# Patient Record
Sex: Female | Born: 1990 | Race: White | Hispanic: No | Marital: Single | Smoking: Never smoker
Health system: Southern US, Community
[De-identification: ages and names within clinical notes are randomized; demographics above are authoritative.]

---
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
## 2012-05-11 ENCOUNTER — Telehealth: Payer: Self-pay | Admitting: Obstetrics & Gynecology

## 2012-05-11 NOTE — Telephone Encounter (Signed)
LMTCB  aa 

## 2012-05-11 NOTE — Telephone Encounter (Signed)
Spoke with pt who has an appt with SM on Friday, but pt just started new job teaching. Pt has a few questions and was wondering if SM could answer them. Pt is getting married in June and has been getting established with birth control. Was started on lo loestrin, but had a constant cycle on it. Pt saw another doctor who put her on Nuvaring. Pt was told to take it out on the 28th and have a cycle, then insert new one on the 1st. Pt has been doing so and her cycles last about a week, which is normal for her. Pt  wanted to know Dr Rondel Baton opinion of nuvaring. Pt also doesn't want to be on her cycle for her honeymoon and wonders if she can alter the schedule to put a new one in safely to avoid a period in June. Pt understands if Dr Hyacinth Meeker still wants to see her, but wanted to see if her questions could be answered possibly without a visit. Please advise.

## 2012-05-12 NOTE — Telephone Encounter (Signed)
I think Nuva ring is a good BC method.  She can use nuva rings one right after the other if she wants to try and skip a cycle.  There is only 28 days worth of hormone in the Nuva ring so if she waits until the 28th of May and takes out the ring and then puts another one right in, she will probably spot.  She should take the first one out on the 21st and then put a new one in.  That second ring should last her for 28 days.  I don't know her wedding date but that should work as long as the wedding is mid month.  If she doesn't understand, just have her come in.  She still may spot doing this.  It's not uncommon but worth a try.

## 2012-05-12 NOTE — Telephone Encounter (Signed)
Appt. Scheduled for Friday with Dr, Hyacinth Meeker to discuss options.

## 2012-05-13 ENCOUNTER — Encounter: Payer: Self-pay | Admitting: Obstetrics & Gynecology

## 2012-05-14 ENCOUNTER — Ambulatory Visit (INDEPENDENT_AMBULATORY_CARE_PROVIDER_SITE_OTHER): Payer: 59 | Admitting: Obstetrics & Gynecology

## 2012-05-14 ENCOUNTER — Institutional Professional Consult (permissible substitution): Payer: Self-pay | Admitting: Obstetrics & Gynecology

## 2012-05-14 ENCOUNTER — Encounter: Payer: Self-pay | Admitting: Obstetrics & Gynecology

## 2012-05-14 VITALS — BP 102/60 | Ht 66.25 in | Wt 134.0 lb

## 2012-05-14 DIAGNOSIS — Z309 Encounter for contraceptive management, unspecified: Secondary | ICD-10-CM

## 2012-05-14 MED ORDER — ETONOGESTREL-ETHINYL ESTRADIOL 0.12-0.015 MG/24HR VA RING: VAGINAL_RING | VAGINAL | Status: DC

## 2012-05-14 NOTE — Patient Instructions (Signed)
Replace Nuva ring today.  Leave for four weeks.  Take out (May 23) for four days.  Have a cycle and replace a new one.  You should be good for your wedding.

## 2012-05-14 NOTE — Progress Notes (Signed)
22 y.o. Married Caucasian G0P0here for discussion of Nuva ring.  Each month she is putting in the Nuva ring on the first of the month and takes it out on the 28th.  She is interested in a [place] IUD but is getting married in less than two months.  She is afraid to have it placed right now and I agree because she will probably end up having irregular bleeding/spotting/cramping during some of the time leading up to her wedding.  She would like to not be bleeding during her wedding or honeymoon in [place].  reveiwed with pt how to accomplish her desires.  She is going to remove and replace Nuva ring now.  She will leave new one in for four weeks and then remove on 5/23.  She will leave ring out for four days and replace a new one then.  This should work for her to change when he cycle will occur.   She would like to try and not have cycles at all.  Asks about safety of this.  Advised she can just use ring continuously but replace before 28 days since this is the most hormone that is in the ring.  She knows if she has heavy spotting to just removed for four days, cycle, and start again.  She voices clear understanding of above.    O: Healthy female, WD WN Affect: normal orientation X 3    A: Contraception surveillance  P: Directions as per above.  Rx for Nuva ring continuously sent to pharmacy.  Two samples given.  15 minutes spent with patient with >50% of time spent in face to face counseling.

## 2012-06-07 ENCOUNTER — Telehealth: Payer: Self-pay | Admitting: Obstetrics & Gynecology

## 2012-06-07 NOTE — Telephone Encounter (Signed)
Patient called about general question about birth control.

## 2012-06-07 NOTE — Telephone Encounter (Signed)
Patient needs reassurance of menstrual cycle while using Nuvaring and taking out on may 23rd as discussed with your due to her wedding coming up in 3 weeks. Last week begining of week had spotting and became heavy bleeding till sat and Sunday got a little less. Question should she still take Nuvaring out on the 23rd and leave out 2 weeks then put another in ? Please advise. Jamie Love. / was hard to keep up with patient concerning this, Very Anxious about her cycle and wedding day/ honeymoon time frame.

## 2012-06-08 NOTE — Telephone Encounter (Signed)
If you read my office note from last visit it is very clear what I told her.  Take ring out on the 23rd.  Leave out for four days only.  Put new ring in after four days.  Then continue using ring normally.  Use for three weeks, then out for one.  Her bleeding may be irregular this month but that is usually what happens when we try to change the days of her cycle for something like a wedding.  The last ring she should have left in place for four weeks.  Make sure she has enough rings.  She may need a sample.  After the wedding, she is going to try and use them continuously but she should not start that yet.  Please let me know if this still isn't clear.

## 2012-06-08 NOTE — Telephone Encounter (Signed)
Patient has started BTB while on Nuvaring and wants to know if that changes Dr Rondel Baton instructions. Advised BTB very common side effect, esp when adjusting a cycle and should not change the plan.  Leave ring in till 06-11-12, regardless of bleeding, then remove for 4 days and restart. Voiced understanding.

## 2012-12-07 ENCOUNTER — Telehealth: Payer: Self-pay | Admitting: Obstetrics & Gynecology

## 2012-12-07 NOTE — Telephone Encounter (Signed)
Patient 's mom calling re: patient having pelvic pain, bleeding for two weeks. Please call patient at number listed to assess her. Patient's mom thinks the patient may need an ultrasound at her next appointment 01/10/13 for her AEX--FYI.

## 2012-12-07 NOTE — Telephone Encounter (Signed)
Message left to return call to Jamie Love at [phone].    

## 2012-12-07 NOTE — Telephone Encounter (Signed)
Patient returning Tracy's call. °

## 2012-12-09 NOTE — Telephone Encounter (Signed)
Message left to return call to Rajesh Wyss at [phone].    

## 2012-12-09 NOTE — Telephone Encounter (Signed)
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 Pt has been having trouble with her birth control and bleeding. For the past 2 months she has been bleeding 3 out of 4 weeks with cramping and "weird abdominal pain." Pt is in [place], but will be home for Thanksgiving. Scheduled appt for 12-15-12 at 10:30 with SM, who pt prefers to see.

## 2012-12-10 NOTE — Telephone Encounter (Signed)
Patient is returning a call to Tracy. °

## 2012-12-13 NOTE — Telephone Encounter (Signed)
Shouldn't be a problem.  Just take out the nuva ring and leave it out.  Send to [place] for precert tomorrow please.

## 2012-12-13 NOTE — Telephone Encounter (Signed)
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 Pt coming to see you at 10:30 Wednesday. She was just wondering if it would be possible to get the [facility] at her appt. It would need a precert and she would need cytotec beforehand. Thoughts?

## 2012-12-13 NOTE — Telephone Encounter (Signed)
Spoke with pt who is still having bleeding off and on since 11-24-12. Pt is spotting some days. Pt reports some cramping that seems to happen mostly on weekends. 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 Pt on Nuvaring and thinks it just isn't the right [facility] for her. She had prolonged bleeding in October as well, bleeding 2.5 to 3 weeks then also. She has been taking her Nuvaring out on the 23rd of each month and putting a new one back in the same day, which is what she was told to do. Pt still interested in [place] and wonders if we would have enough time to plan an insertion between now and her appt on Wednesday. Advised I would route to SM and look into precerting.

## 2012-12-13 NOTE — Telephone Encounter (Signed)
See about 4pm tomorrow.

## 2012-12-13 NOTE — Telephone Encounter (Signed)
LMTCB for update on how she is doing and if she is keeping appt on 11-26.

## 2012-12-14 ENCOUNTER — Other Ambulatory Visit: Payer: Self-pay | Admitting: Orthopedic Surgery

## 2012-12-14 MED ORDER — MISOPROSTOL 200 MCG PO TABS: ORAL_TABLET | ORAL | Status: DC

## 2012-12-14 NOTE — Telephone Encounter (Signed)
RX done to pharmacy.  Make sure takes 800mg  Motrin about 1 hour before appt.  Encounter closed.

## 2012-12-14 NOTE — Telephone Encounter (Signed)
Spoke with pt who requests [facility] in [place]. Pt will be coming home tonight and her mom will pick up med for her. Advised Shanda Bumps will be calling back with OOP cost. 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

## 2012-12-14 NOTE — Telephone Encounter (Signed)
Reached pt's VM confirming name and LM to take out Nuvaring and leave it out. Use BUM. Advised that SM is agreeable for Skyla at her appt. Pt to call back with pharmacy info for [place] to send cytotec Rx.

## 2012-12-14 NOTE — Telephone Encounter (Signed)
Dr. Hyacinth Meeker,  Do you want cytotec 200 mg PV tonight and in the morning for her?

## 2012-12-15 ENCOUNTER — Other Ambulatory Visit: Payer: Self-pay | Admitting: *Deleted

## 2012-12-15 ENCOUNTER — Encounter: Payer: Self-pay | Admitting: Obstetrics & Gynecology

## 2012-12-15 ENCOUNTER — Other Ambulatory Visit: Payer: Self-pay

## 2012-12-15 ENCOUNTER — Ambulatory Visit
Admission: RE | Admit: 2012-12-15 | Discharge: 2012-12-15 | Disposition: A | Discharge status: Home / Self Care | Payer: 59 | Source: Ambulatory Visit | Attending: Obstetrics & Gynecology | Admitting: Obstetrics & Gynecology

## 2012-12-15 ENCOUNTER — Ambulatory Visit (INDEPENDENT_AMBULATORY_CARE_PROVIDER_SITE_OTHER): Payer: 59 | Admitting: Obstetrics & Gynecology

## 2012-12-15 ENCOUNTER — Other Ambulatory Visit: Payer: Self-pay | Admitting: Obstetrics & Gynecology

## 2012-12-15 VITALS — BP 116/76 | HR 72 | Resp 14 | Ht 66.25 in | Wt 135.0 lb

## 2012-12-15 DIAGNOSIS — N9489 Other specified conditions associated with female genital organs and menstrual cycle: Secondary | ICD-10-CM

## 2012-12-15 DIAGNOSIS — R5381 Other malaise: Secondary | ICD-10-CM

## 2012-12-15 DIAGNOSIS — N949 Unspecified condition associated with female genital organs and menstrual cycle: Secondary | ICD-10-CM

## 2012-12-15 DIAGNOSIS — N938 Other specified abnormal uterine and vaginal bleeding: Secondary | ICD-10-CM

## 2012-12-15 DIAGNOSIS — Z30430 Encounter for insertion of intrauterine contraceptive device: Secondary | ICD-10-CM

## 2012-12-15 LAB — CBC
HCT: 36.2 % (ref 36.0–46.0)
MCH: 29.2 pg (ref 26.0–34.0)
MCHC: 35.4 g/dL (ref 30.0–36.0)
MCV: 82.5 fL (ref 78.0–100.0)
Platelets: 219 10*3/uL (ref 150–400)
RBC: 4.39 MIL/uL (ref 3.87–5.11)
RDW: 13.2 % (ref 11.5–15.5)
WBC: 5.8 10*3/uL (ref 4.0–10.5)

## 2012-12-15 MED ORDER — AZITHROMYCIN 250 MG PO TABS: ORAL_TABLET | ORAL | Status: DC

## 2012-12-15 NOTE — Patient Instructions (Signed)
IUD PLACEMENT POST PROCEDURE INSTRUCTIONS  1. You may take Ibuprofen, Aleve or Tylenol for pain as needed.      Cramping should resolve within 24 hours.  2. You may have a small amount of spotting. You should wear a mini pad for the next few days  3. You may have intercourse after 24 hours. If you are using this for birth control, it is effective immediately.  4. You need to call if you have any pelvic pain, fever, heavy bleeding or foul smelling vaginal discharge. Irregular bleeding is common the first several months after having an IUD placed. You do not need to for this reason unless you are                     concerned.   5. Shower or bathe as normal  6. You should have a follow-up appointment in 4-8 weeks for a re-check to make sure you are not having any problems.  

## 2012-12-15 NOTE — Progress Notes (Signed)
Subjective:     Patient ID: Jamie Love, female   DOB: [birth year], 22 y.o.   MRN: [id number]  HPI 22 yo g0 here for [facility] IUD placement.  Before that, she wants to tell me about some symptoms that have been going on.  She did not discuss this with traige nurse when called for IUD placement.  Has been using Nuva ring with no regularity in cycles.  Has been on loloestrin as well as Loestrin with same issues.  Has been bleeding for a month now.  Took out [employer] on Sunday and flow has been heavier since.    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  Has a sinus issue now.  Wants recommendations.  Feeling fatigued.  Wonders if related to bleeding.    Also, having some GI issues and pelvic pain.  Can't always tell which is which.  Sometimes feels like she is cramping, sometimes like the pain is higher and is "a stomach ache".  Often feels relief from this if she has a bowel movement.  No constipation.  No diarrhea.  Having more stress with work.  Doesn't love classroom or teaching.  Calls her mom almost every morning with complaints of work.  A little tearful today.  Review of Systems  All other systems reviewed and are negative.       Objective:   Physical Exam  Constitutional: She appears well-developed and well-nourished.  HENT:  Head: Normocephalic and atraumatic.  Neck: Normal range of motion. Neck supple. No thyromegaly present.  Cardiovascular: Normal rate and regular rhythm.   Pulmonary/Chest: Effort normal and breath sounds normal.  Abdominal: Soft. Bowel sounds are normal.  Genitourinary: Vagina normal and uterus normal.  Musculoskeletal: Normal range of motion.  Lymphadenopathy:    She has no cervical adenopathy.  Skin: Skin is warm and dry.  Psychiatric: She has a normal mood and affect.   Decided to proceed with IUD placement.  After patient read information booklet and all questions were answered, informed consent was obtained.    Procedure:  Speculum inserted into  vagina. Cervix visualized and cleansed with betadine solution X 3. Paracervical block was placed.  1% Lidocaine was used.  10cc total.  Tenaculum placed on cervix at 12 o'clock position.  Uterus sounded to 8 centimeters.  Decided at this time to place Mirena instead of Skyla IUD.   IUD removed from sterile packet and under sterile conditions inserted to fundus of uterus.  Introducer removed without difficulty.  IUD string trimmed to 3 centimeters.  Remainder string given to patient to feel for identification.  Tenaculum removed.  minimal bleeding noted.  Speculum removed.  Uterus palpated normal.  Patient tolerated procedure well.  IUD Lot #:[id number].  Exp: 8/16.  Package information attached to consent and scanned into EPIC.    Assessment:     IUD placement today DUB, pelvic pain Possible IBS     Plan:     P: Return to office 4 weeks for recheck Pt knows IUD needs to be replaced approximately 5 years.  Post procedure instructions provided. PUS today. TSH and CBC today Z pak to pharmacy.

## 2012-12-16 LAB — TSH: TSH: 0.915 u[IU]/mL (ref 0.350–4.500)

## 2012-12-20 ENCOUNTER — Encounter: Payer: Self-pay | Admitting: Obstetrics & Gynecology

## 2012-12-20 ENCOUNTER — Telehealth: Payer: Self-pay

## 2012-12-20 NOTE — Telephone Encounter (Signed)
Patient is calling Kelly back. °

## 2012-12-20 NOTE — Telephone Encounter (Signed)
LMTCB to check on patient.

## 2012-12-21 NOTE — Telephone Encounter (Signed)
See result note for lab work.

## 2013-01-10 ENCOUNTER — Ambulatory Visit (INDEPENDENT_AMBULATORY_CARE_PROVIDER_SITE_OTHER): Payer: 59 | Admitting: Obstetrics & Gynecology

## 2013-01-10 ENCOUNTER — Encounter: Payer: Self-pay | Admitting: Obstetrics & Gynecology

## 2013-01-10 VITALS — BP 124/62 | HR 58 | Resp 16 | Ht 66.25 in | Wt 137.4 lb

## 2013-01-10 DIAGNOSIS — Z01419 Encounter for gynecological examination (general) (routine) without abnormal findings: Secondary | ICD-10-CM

## 2013-01-10 NOTE — Progress Notes (Signed)
22 y.o. G0P0 Married CaucasianF here for annual exam.  Had IUD placed about a month ago.  No spotting.  Cramping is gone.  Hasn't had a "real" cycle yet.  Not feeling hormonal.  Out of school for break and she can tell that this has helped as well.    Patient's last menstrual period was 12/20/2012.          Sexually active: yes  The current method of family planning is IUD.    Exercising: yes  cardio and weights Smoker:  no  Health Maintenance: Pap:  10/30/11 WNL History of abnormal Pap:  no MMG:  none Colonoscopy:  none BMD:   none  TDaP:  ? 2008 Screening Labs: none today, Hb today: none today, Urine today: not done   reports that she has never smoked. She has never used smokeless tobacco. She reports that she does not drink alcohol or use illicit drugs.  History reviewed. No pertinent past medical history.  Past Surgical History  Procedure Laterality Date  . Fracture surgery  5/08    tibia/fibula compound fracture (3 surgeries)    Current Outpatient Prescriptions  Medication Sig Dispense Refill  . levonorgestrel (MIRENA) 20 MCG/24HR IUD 1 each by Intrauterine route once.       No current facility-administered medications for this visit.    Family History  Problem Relation Age of Onset  . Breast cancer Maternal Grandmother 37  . Bladder Cancer Other     MGGF    ROS:  Pertinent items are noted in HPI.  Otherwise, a comprehensive ROS was negative.  Exam:   BP 124/62  Pulse 58  Resp 16  Ht 5' 6.25" (1.683 m)  Wt 137 lb 6.4 oz (62.324 kg)  BMI 22.00 kg/m2  LMP 12/20/2012  Weight change: +7lbs   Height: 5' 6.25" (168.3 cm)  Ht Readings from Last 3 Encounters:  01/10/13 5' 6.25" (1.683 m)  12/15/12 5' 6.25" (1.683 m)  05/14/12 5' 6.25" (1.683 m)    General appearance: alert, cooperative and appears stated age Head: Normocephalic, without obvious abnormality, atraumatic Neck: no adenopathy, supple, symmetrical, trachea midline and thyroid normal to inspection and  palpation Lungs: clear to auscultation bilaterally Breasts: normal appearance, no masses or tenderness Heart: regular rate and rhythm Abdomen: soft, non-tender; bowel sounds normal; no masses,  no organomegaly Extremities: extremities normal, atraumatic, no cyanosis or edema Skin: Skin color, texture, turgor normal. No rashes or lesions Lymph nodes: Cervical, supraclavicular, and axillary nodes normal. No abnormal inguinal nodes palpated Neurologic: Grossly normal   Pelvic: External genitalia:  no lesions              Urethra:  normal appearing urethra with no masses, tenderness or lesions              Bartholins and Skenes: normal                 Vagina: normal appearing vagina with normal color and discharge, no lesions              Cervix: no lesions, IUD string seen              Pap taken: no Bimanual Exam:  Uterus:  normal size, contour, position, consistency, mobility, non-tender              Adnexa: normal adnexa and no mass, fullness, tenderness               Rectovaginal: Confirms  Anus:  normal sphincter tone, no lesions  A:  Well Woman with normal exam S/P IUD placement, Mirena placed 12/15/12  P:   Mammogram starting age 77 pap smear 2013, neg.  No Pap today. return annually or prn  An After Visit Summary was printed and given to the patient.

## 2013-01-10 NOTE — Patient Instructions (Signed)

## 2013-04-19 ENCOUNTER — Telehealth: Payer: Self-pay | Admitting: Obstetrics & Gynecology

## 2013-04-19 NOTE — Telephone Encounter (Signed)
Patient wants to know if dr Hyacinth Meekermiller would call in some more zpac. She thinks she has had a sinus infection for about a month now and said dr Hyacinth Meekermiller has just called them in before for her. Unable to even come in for appt if we wanted her too. But advised that she may have to go to a urgent care since we don't do primary care. She lives in [place].

## 2013-04-19 NOTE — Telephone Encounter (Signed)
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  Reports sinus congestion and cold symptoms for 5 weeks and is going out of town on spring break and was hoping Dr Hyacinth MeekerMiller would call in z-pack.  Advised that it is our protocol that patients need to be seen and evaluated before treatment and although we have made exceptions in past, this is not our policy.  She really needs to be seen and possibly need additional treament, recommend urgent care for evaluation. Patient agreeable.  Routing to provider for final review. Patient agreeable to disposition. Will close encounter

## 2013-11-04 ENCOUNTER — Other Ambulatory Visit: Payer: Self-pay

## 2014-12-10 IMAGING — US US PELVIS COMPLETE
1 series · 14 of 25 positions shown · non-contrast
Comparison: CT abdomen pelvis 11/06/2004.

CLINICAL DATA: Dysfunctional uterine bleeding, pelvic pain.

EXAM:
TRANSABDOMINAL AND TRANSVAGINAL ULTRASOUND OF PELVIS
TECHNIQUE: Both transabdominal and transvaginal ultrasound examinations of the
pelvis were performed. Transabdominal technique was performed for
global imaging of the pelvis including uterus, ovaries, adnexal
regions, and pelvic cul-de-sac. It was necessary to proceed with
endovaginal exam following the transabdominal exam to visualize the
endometrium and ovaries.

[Series 1: us pelvis complete · 0.32mm/px · 14 of 53 slices shown]
[im 1/53]
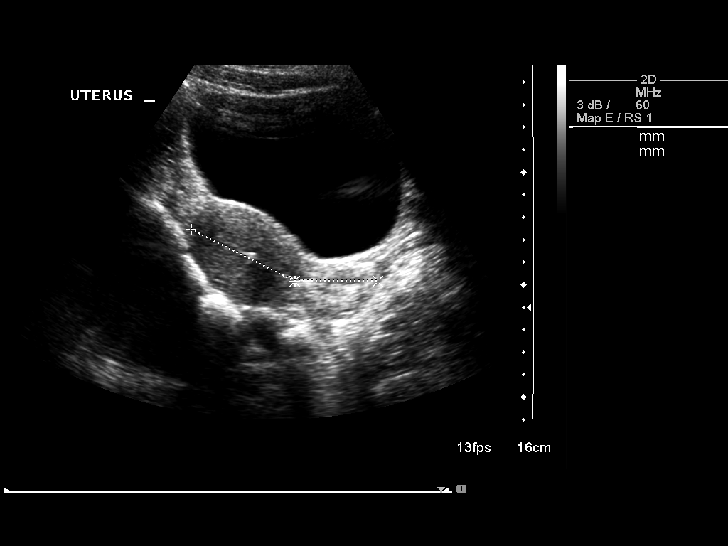
[im 5/53]
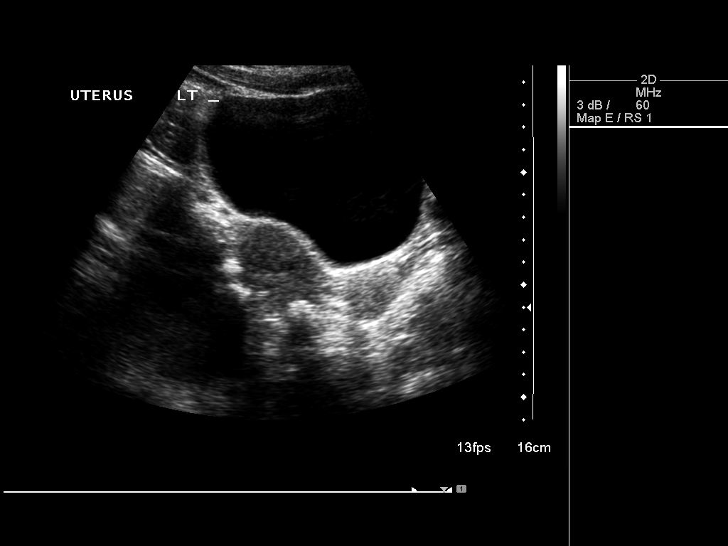
[im 9/53]
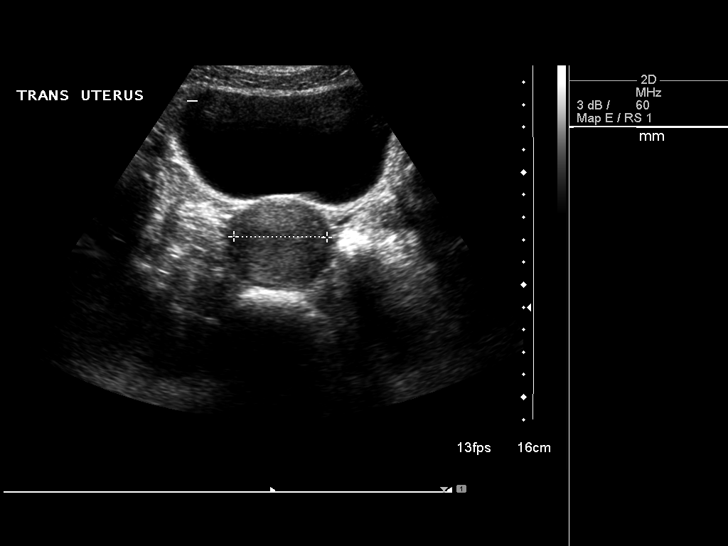
[im 14/53]
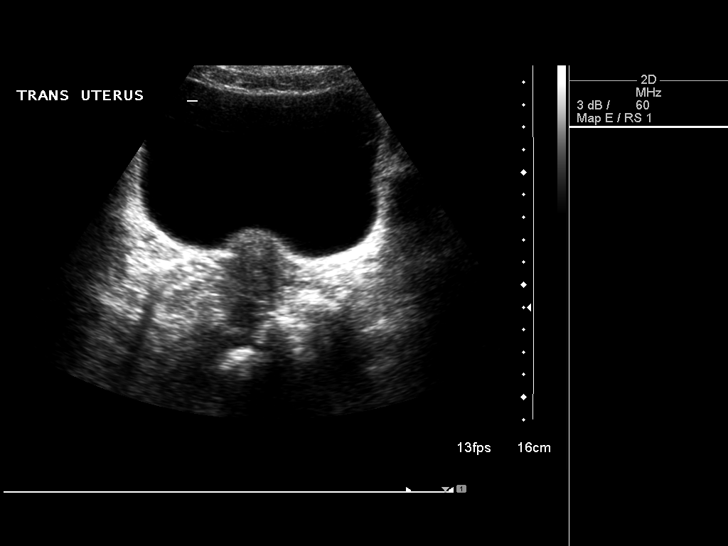
[im 18/53]
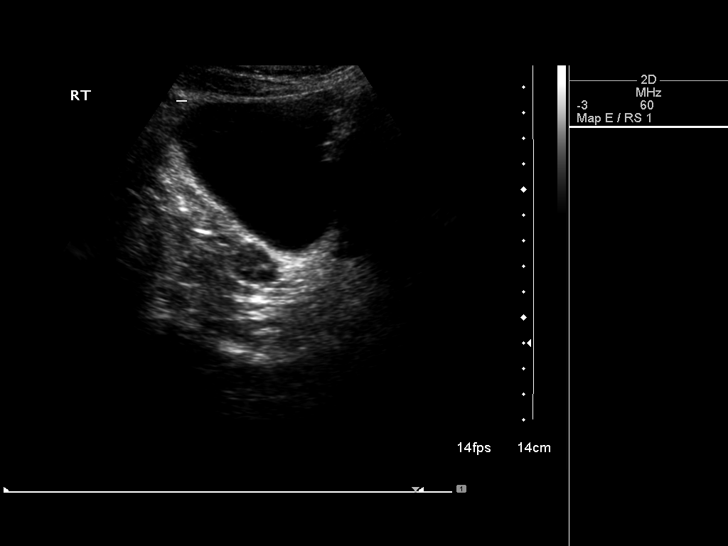
[im 20/53]
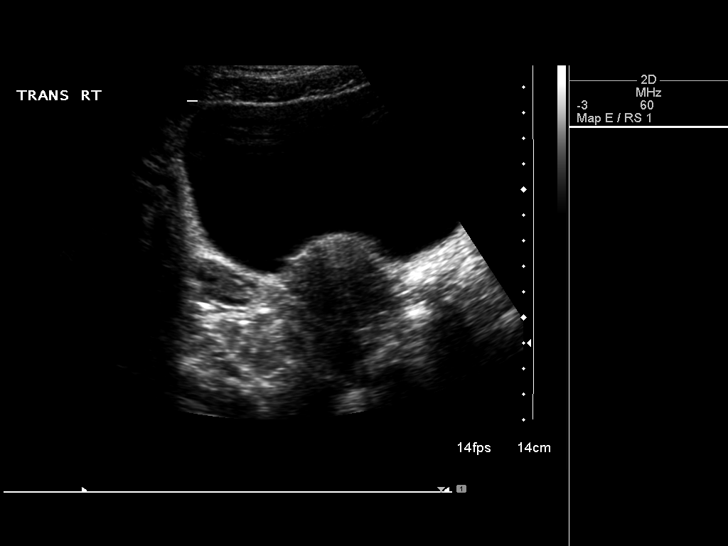
[im 24/53]
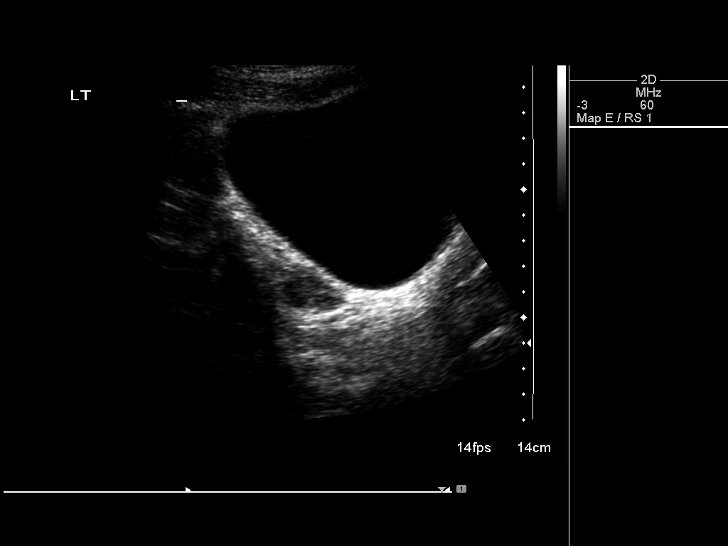
[im 29/53]
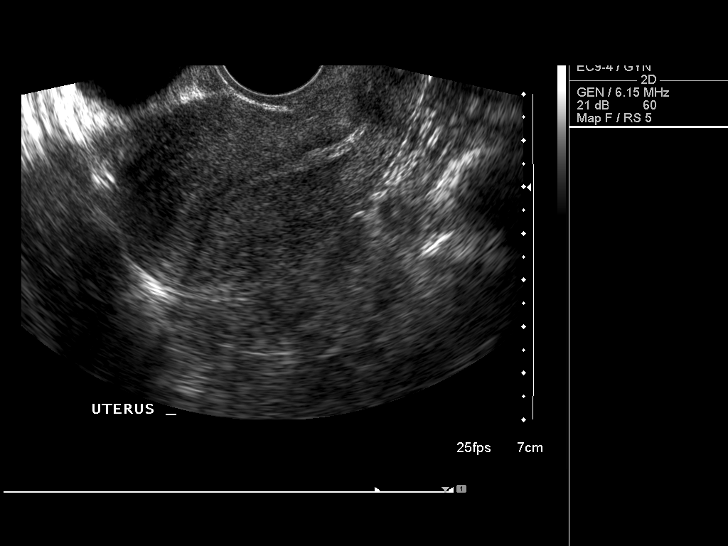
[im 33/53]
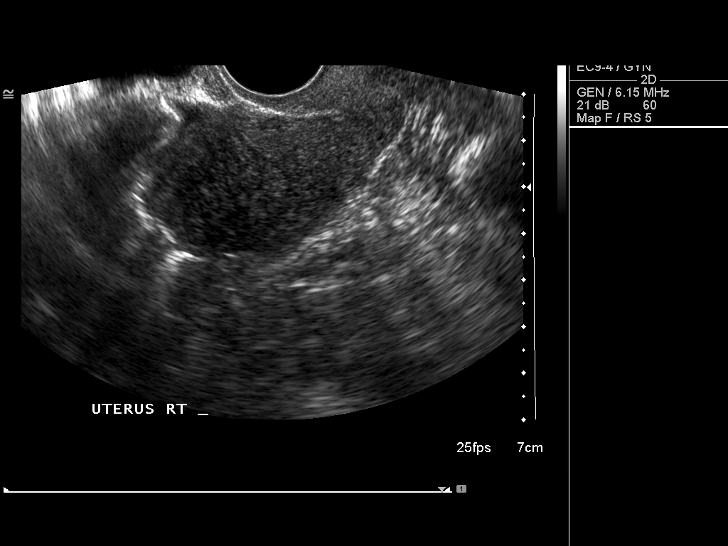
[im 35/53]
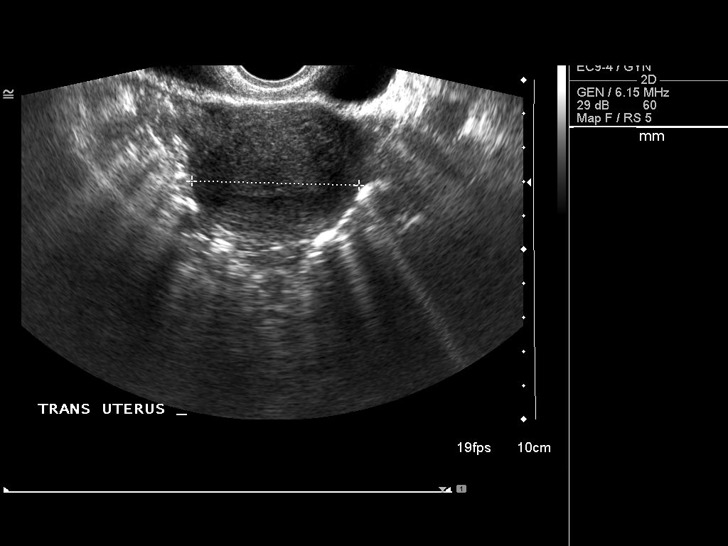
[im 40/53]
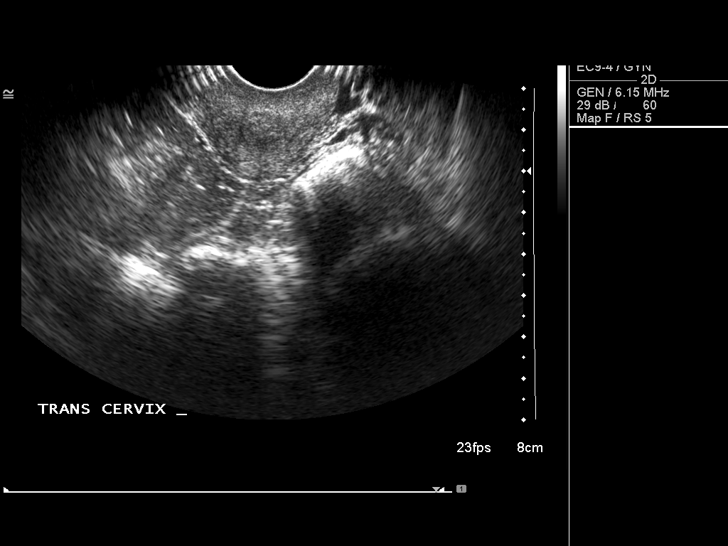
[im 44/53]
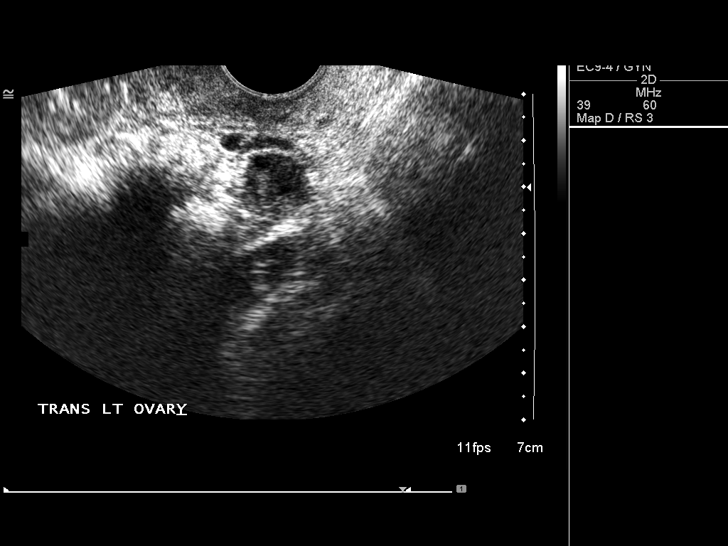
[im 48/53]
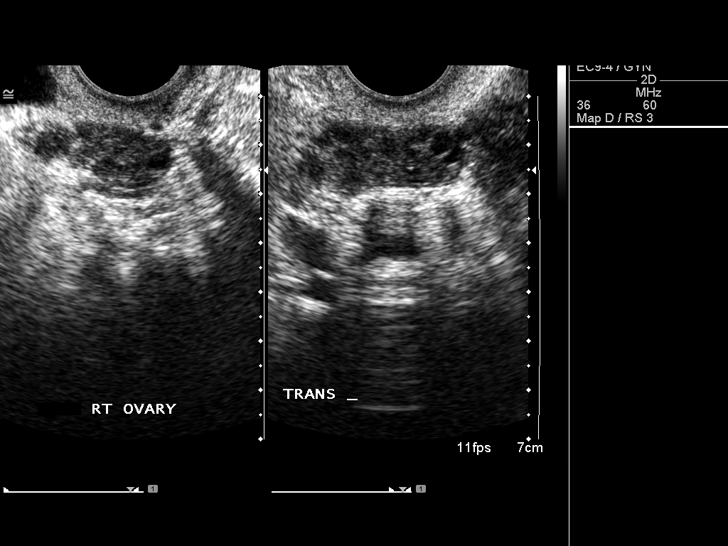
[im 53/53]
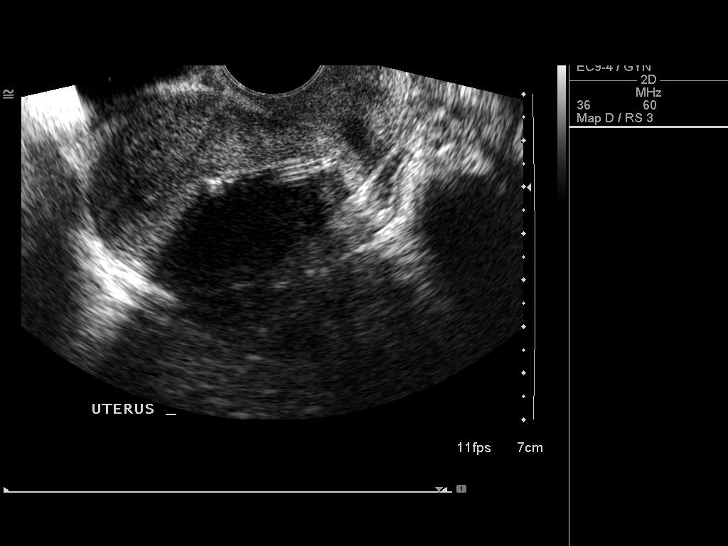

[14 of 25 positions shown; findings below may reference images not displayed]

FINDINGS: Uterus

Measurements: 8.3 x 4.2 x 4.9 cm. No fibroids or other mass
visualized.

Endometrium

Thickness: 4 mm. Intrauterine contraceptive device is seen within
the endometrial canal.

Right ovary

Measurements: 2.8 x 1.3 x 3.2 cm. Normal appearance/no adnexal mass.

Left ovary

Measurements: 3.0 x 1.5 x 1.7 cm. Normal appearance/no adnexal mass.

Other findings

Trace free fluid.
IMPRESSION: 1. No acute findings.  Trace free fluid is likely physiologic.
2. Intrauterine contraceptive device in appropriate position.
